# Patient Record
Sex: Male | Born: 1995 | Race: White | Hispanic: No | Marital: Single | Smoking: Never smoker
Health system: Southern US, Community
[De-identification: ages and names within clinical notes are randomized; demographics above are authoritative.]

---
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
## 2017-01-13 ENCOUNTER — Other Ambulatory Visit: Payer: Self-pay | Admitting: Gastroenterology

## 2017-01-13 DIAGNOSIS — R1032 Left lower quadrant pain: Secondary | ICD-10-CM

## 2017-01-20 ENCOUNTER — Ambulatory Visit
Admission: RE | Admit: 2017-01-20 | Discharge: 2017-01-20 | Disposition: A | Discharge status: Home / Self Care | Payer: BLUE CROSS/BLUE SHIELD | Source: Ambulatory Visit | Attending: Gastroenterology | Admitting: Gastroenterology

## 2017-01-20 DIAGNOSIS — R1032 Left lower quadrant pain: Secondary | ICD-10-CM

## 2017-01-20 IMAGING — CT CT ABD-PELV W/ CM
2 of 4 series · 16 of 46 positions shown, 18 images · IV contrast (APPLIED)
Comparison: None.

CLINICAL DATA: Left lower quadrant abdominal pain.

EXAM:
CT ABDOMEN AND PELVIS WITH CONTRAST
TECHNIQUE: Multidetector CT imaging of the abdomen and pelvis was performed
using the standard protocol following bolus administration of
intravenous contrast.
CONTRAST:  100mL [FP] IOPAMIDOL ([FP]) INJECTION 61%

[Series 2: abd/pelvis w/cm · axial · 0.69mm/px · z∈[+630,+1075]mm · 13 of 99 slices shown, 15 images]
[im 5/99  soft-tissue]
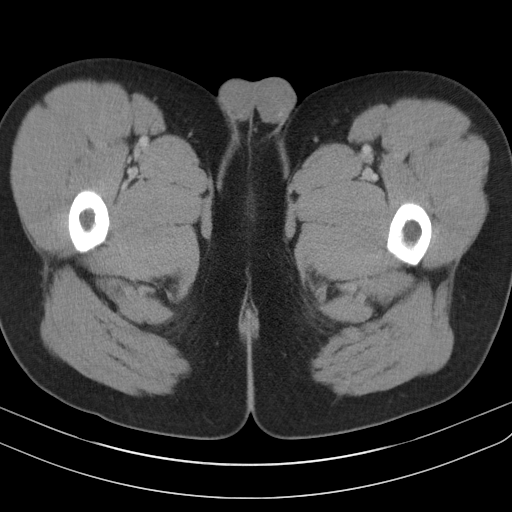
[im 5/99  bone]
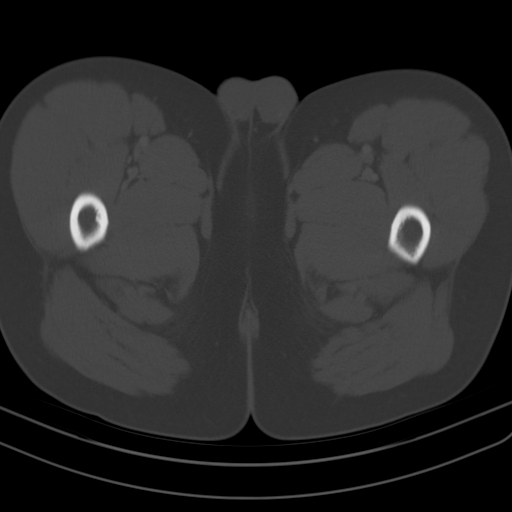
[im 13/99  soft-tissue]
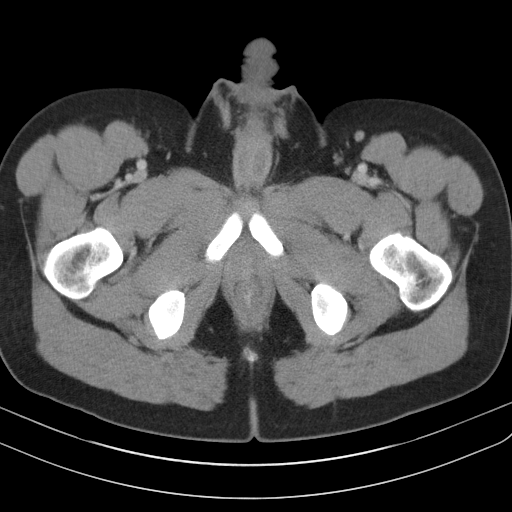
[im 21/99  soft-tissue]
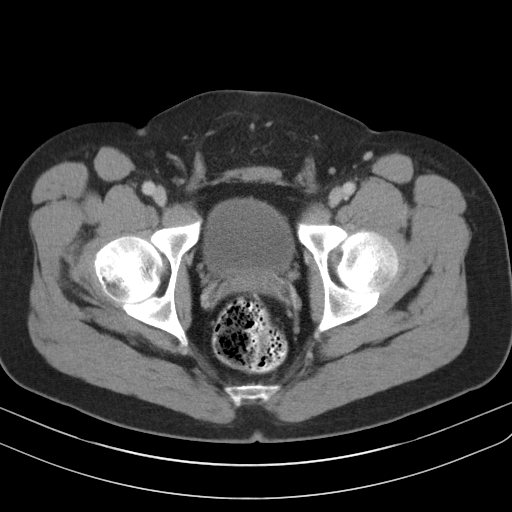
[im 29/99  soft-tissue]
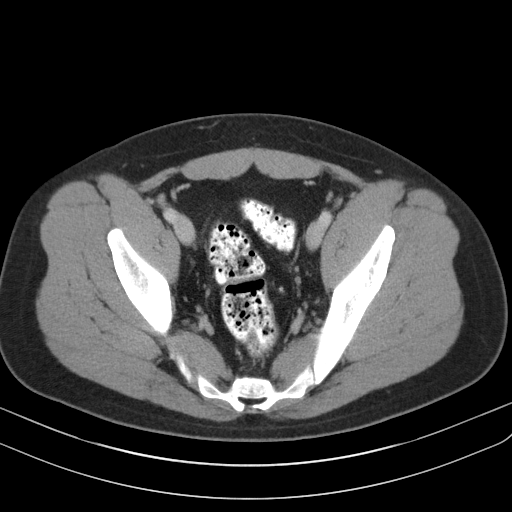
[im 33/99  soft-tissue]
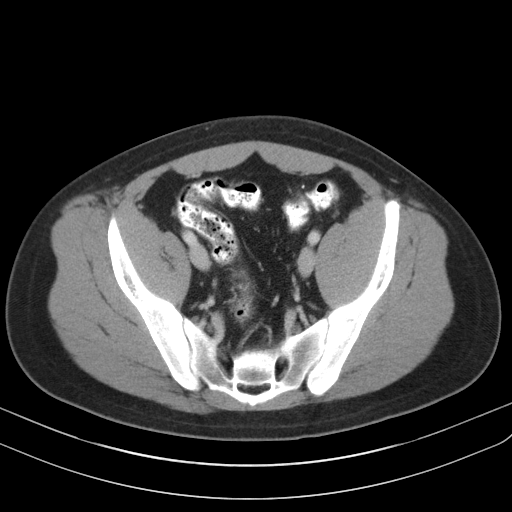
[im 41/99  soft-tissue]
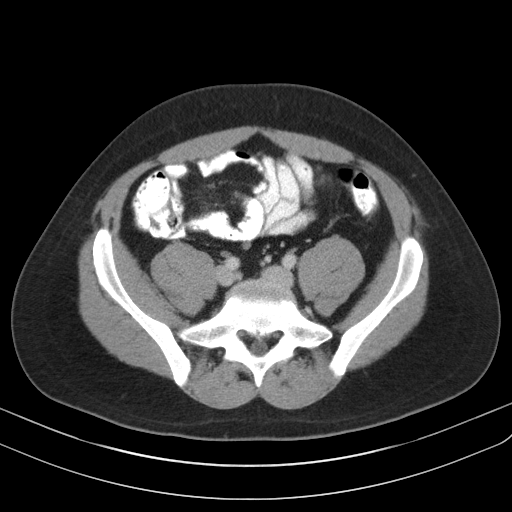
[im 50/99  soft-tissue]
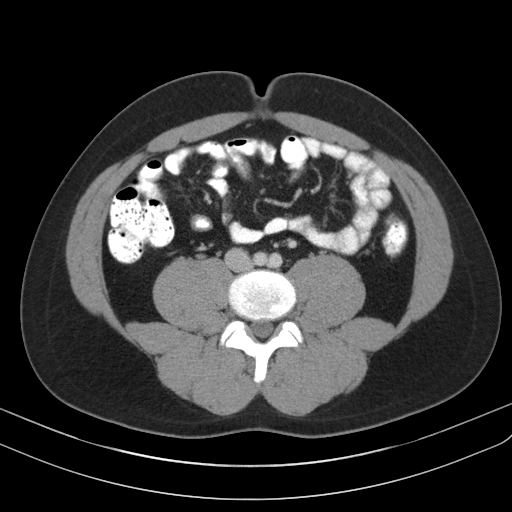
[im 58/99  soft-tissue]
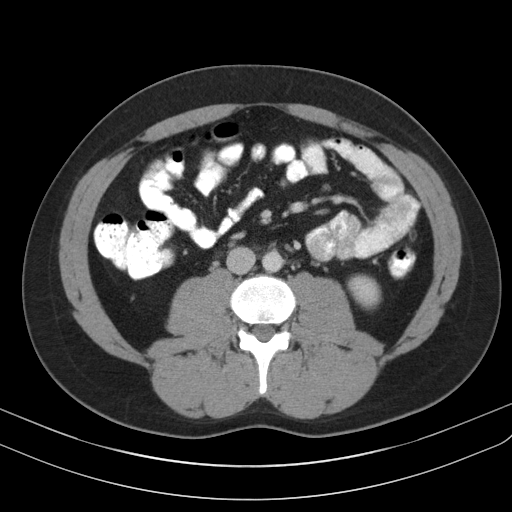
[im 66/99  soft-tissue]
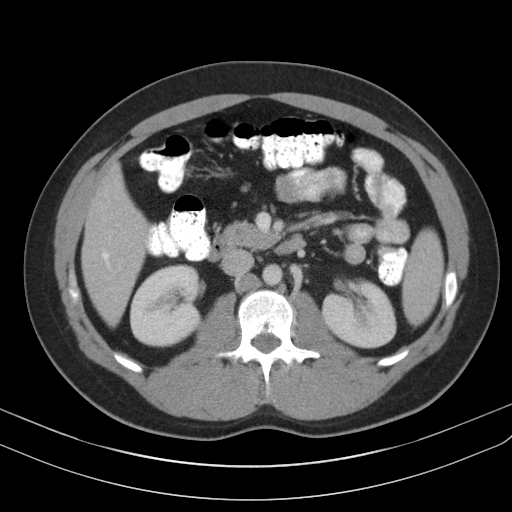
[im 66/99  bone]
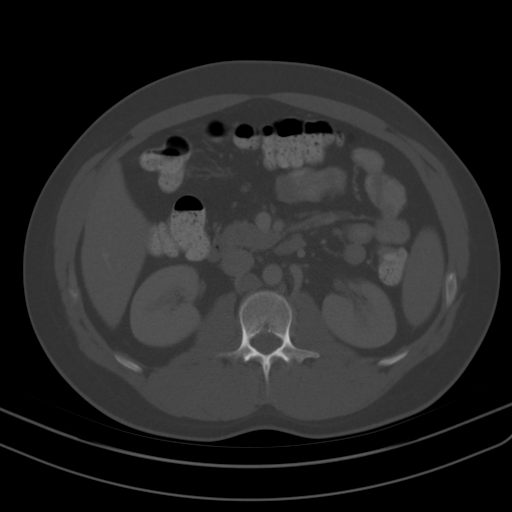
[im 70/99  soft-tissue]
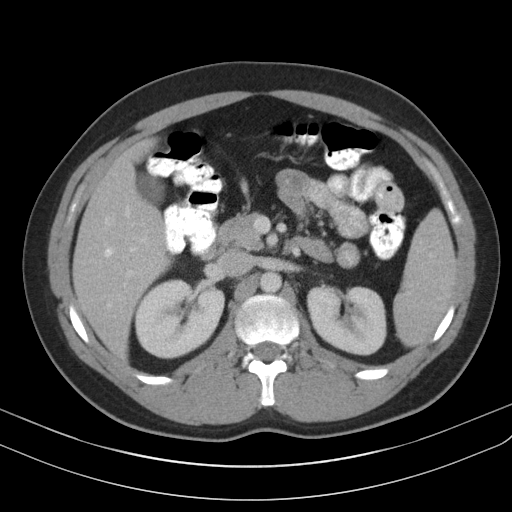
[im 78/99  soft-tissue]
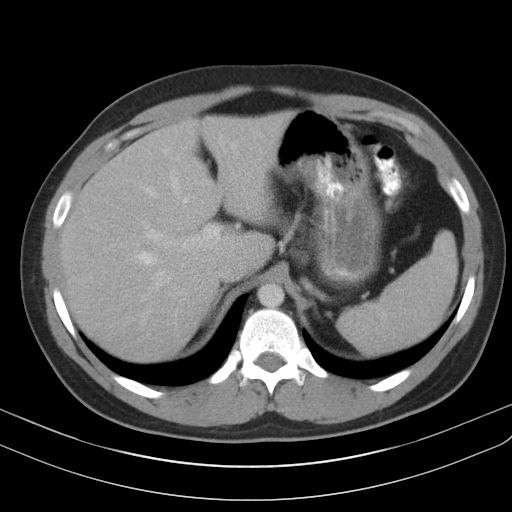
[im 86/99  soft-tissue]
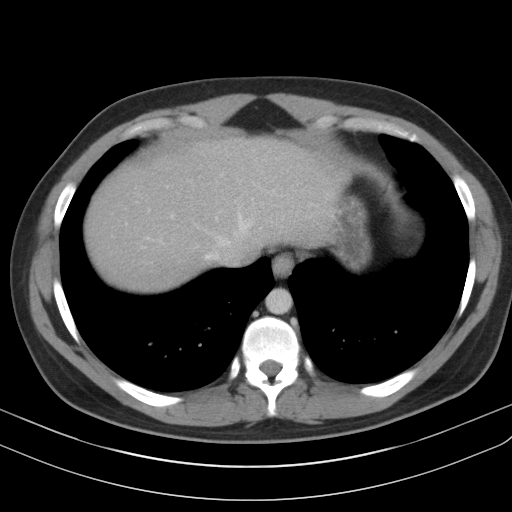
[im 94/99  soft-tissue]
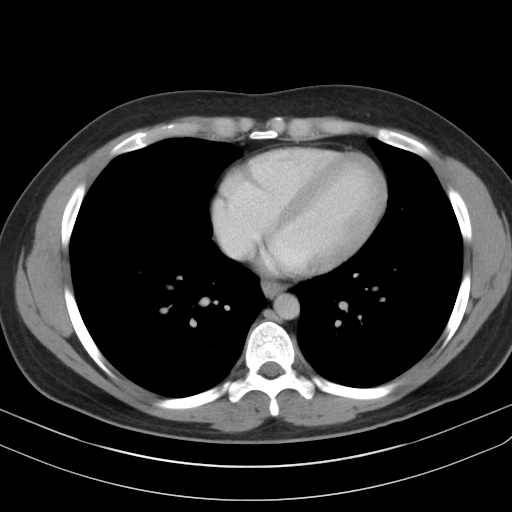

[Series 3: cor · coronal · 0.67mm/px · 3 of 83 slices shown]
[im 28/83  soft-tissue]
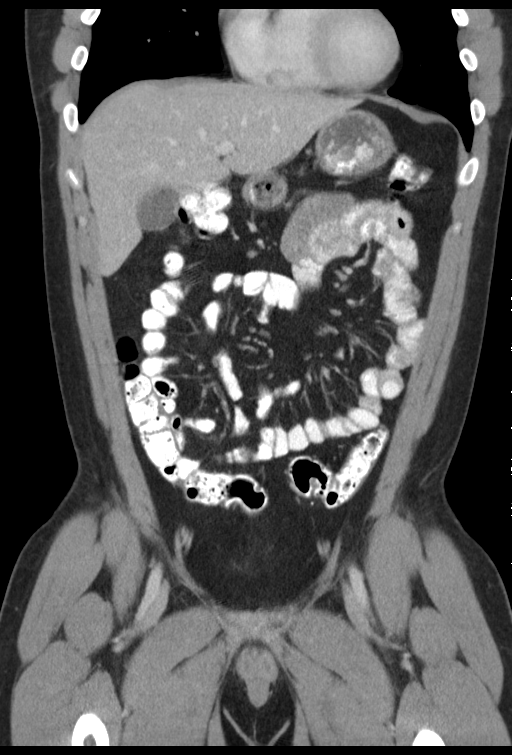
[im 37/83  soft-tissue]
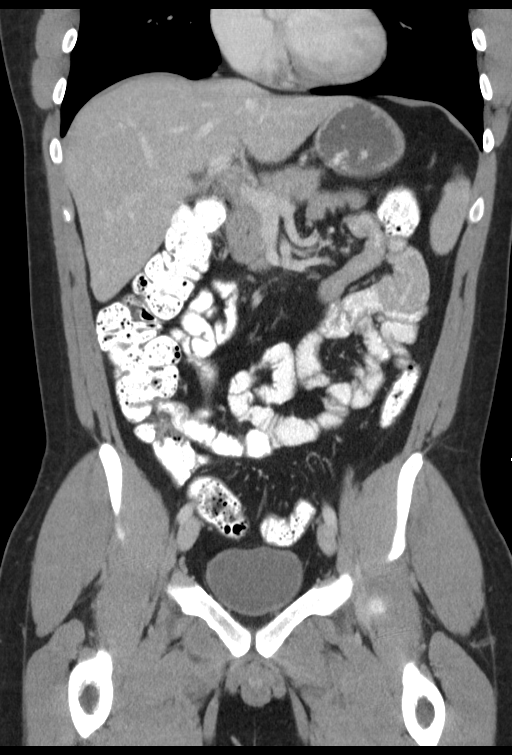
[im 46/83  soft-tissue]
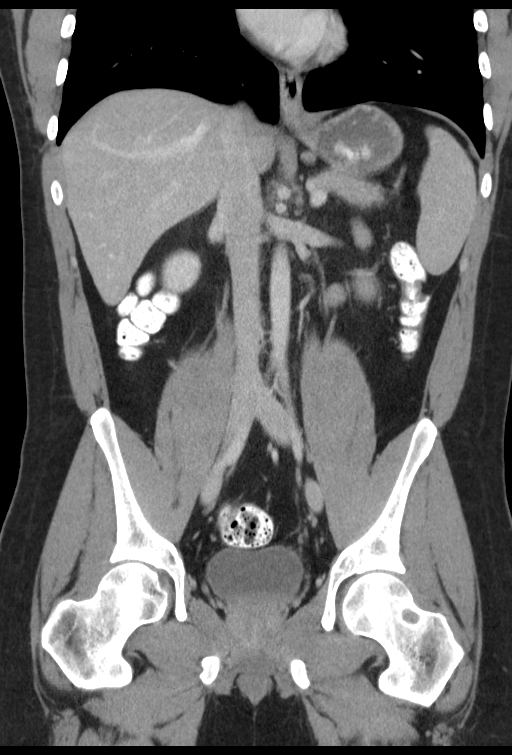

[16 of 46 positions shown; findings below may reference images not displayed]

FINDINGS: Lower chest: No acute abnormality.

Hepatobiliary: No focal liver abnormality is seen. No gallstones,
gallbladder wall thickening, or biliary dilatation.

Pancreas: Unremarkable. No pancreatic ductal dilatation or
surrounding inflammatory changes.

Spleen: Normal in size without focal abnormality.

Adrenals/Urinary Tract: Adrenal glands are unremarkable. Kidneys are
normal, without renal calculi, focal lesion, or hydronephrosis.
Bladder is unremarkable.

Stomach/Bowel: Stomach is within normal limits. Appendix appears
normal. No evidence of bowel wall thickening, distention, or
inflammatory changes.

Vascular/Lymphatic: No significant vascular findings are present. No
enlarged abdominal or pelvic lymph nodes.

Reproductive: Prostate is unremarkable.

Other: No abdominal wall hernia or abnormality. No abdominopelvic
ascites.

Musculoskeletal: No acute or significant osseous findings.
IMPRESSION: No abnormality seen in the abdomen or pelvis.

## 2017-01-20 MED ORDER — IOPAMIDOL (ISOVUE-300) INJECTION 61%
100.0000 mL | Freq: Once | INTRAVENOUS | Status: AC | PRN
  Administered 2017-01-20: 100 mL via INTRAVENOUS
